# Patient Record
Sex: Female | Born: 1960 | Race: White | Hispanic: No | Marital: Single | Smoking: Current every day smoker
Health system: Southern US, Community
[De-identification: ages and names within clinical notes are randomized; demographics above are authoritative.]

## PROBLEM LIST (undated history)

## (undated) DIAGNOSIS — J984 Other disorders of lung: Secondary | ICD-10-CM

## (undated) DIAGNOSIS — G5791 Unspecified mononeuropathy of right lower limb: Secondary | ICD-10-CM

## (undated) DIAGNOSIS — G5792 Unspecified mononeuropathy of left lower limb: Secondary | ICD-10-CM

## (undated) DIAGNOSIS — I776 Arteritis, unspecified: Secondary | ICD-10-CM

## (undated) DIAGNOSIS — G5600 Carpal tunnel syndrome, unspecified upper limb: Secondary | ICD-10-CM

## (undated) HISTORY — PX: TONSILLECTOMY: SUR1361

---

## 2015-07-19 ENCOUNTER — Encounter: Payer: Self-pay | Admitting: Emergency Medicine

## 2015-07-19 ENCOUNTER — Emergency Department: Payer: No Typology Code available for payment source

## 2015-07-19 ENCOUNTER — Emergency Department
Admission: EM | Admit: 2015-07-19 | Discharge: 2015-07-19 | Disposition: A | Discharge status: Home / Self Care | Payer: No Typology Code available for payment source | Attending: Emergency Medicine | Admitting: Emergency Medicine

## 2015-07-19 DIAGNOSIS — F17210 Nicotine dependence, cigarettes, uncomplicated: Secondary | ICD-10-CM | POA: Insufficient documentation

## 2015-07-19 DIAGNOSIS — S161XXA Strain of muscle, fascia and tendon at neck level, initial encounter: Secondary | ICD-10-CM | POA: Insufficient documentation

## 2015-07-19 DIAGNOSIS — Y939 Activity, unspecified: Secondary | ICD-10-CM | POA: Diagnosis not present

## 2015-07-19 DIAGNOSIS — S199XXA Unspecified injury of neck, initial encounter: Secondary | ICD-10-CM | POA: Diagnosis present

## 2015-07-19 DIAGNOSIS — Y92410 Unspecified street and highway as the place of occurrence of the external cause: Secondary | ICD-10-CM | POA: Insufficient documentation

## 2015-07-19 DIAGNOSIS — Y999 Unspecified external cause status: Secondary | ICD-10-CM | POA: Diagnosis not present

## 2015-07-19 HISTORY — DX: Arteritis, unspecified: I77.6

## 2015-07-19 HISTORY — DX: Carpal tunnel syndrome, unspecified upper limb: G56.00

## 2015-07-19 HISTORY — DX: Unspecified mononeuropathy of left lower limb: G57.92

## 2015-07-19 HISTORY — DX: Unspecified mononeuropathy of right lower limb: G57.91

## 2015-07-19 HISTORY — DX: Other disorders of lung: J98.4

## 2015-07-19 MED ORDER — ACETAMINOPHEN 500 MG PO TABS
1000.0000 mg | ORAL_TABLET | Freq: Once | ORAL | Status: AC
  Administered 2015-07-19: 1000 mg via ORAL
  Filled 2015-07-19: qty 2

## 2015-07-19 NOTE — ED Notes (Signed)
This nurse attempted to call [facility] with no success.  Message left.
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
## 2015-07-19 NOTE — ED Notes (Signed)
Pt calling potential rides now.

## 2015-07-19 NOTE — Discharge Instructions (Signed)

## 2015-07-19 NOTE — ED Notes (Signed)
Pt sent to lobby to continue to call for ride.

## 2015-07-19 NOTE — ED Notes (Signed)
Patient transported to CT 

## 2015-07-19 NOTE — ED Notes (Signed)
This nurse spoke with employee of [employer].  Shelter aware that patient is in ED and will be able to return, employee stated no transportation will be available.
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
